# Patient Record
Sex: Male | Born: 2005 | Race: Black or African American | Hispanic: No | Marital: Single | Smoking: Never smoker
Health system: Southern US, Community
[De-identification: ages and names within clinical notes are randomized; demographics above are authoritative.]

## PROBLEM LIST (undated history)

## (undated) DIAGNOSIS — F840 Autistic disorder: Secondary | ICD-10-CM

## (undated) DIAGNOSIS — R569 Unspecified convulsions: Secondary | ICD-10-CM

---
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
## 2024-01-03 ENCOUNTER — Other Ambulatory Visit (INDEPENDENT_AMBULATORY_CARE_PROVIDER_SITE_OTHER): Payer: Self-pay | Admitting: Neurology

## 2024-01-03 MED ORDER — DIVALPROEX SODIUM 500 MG PO DR TAB: 500.0000 mg | DELAYED_RELEASE_TABLET | Freq: Two times a day (BID) | ORAL | 1 refills | Status: DC

## 2024-01-03 NOTE — Telephone Encounter (Signed)
 Mom called in requesting a refill be sent to the pharmacy today because they are leaving to go out of town on [place]. They will be away for 2 months. Mom is requesting a refill on Depakote . Please reach out to mom.

## 2024-01-03 NOTE — Telephone Encounter (Signed)
 Called pharmacy to see if patient had any refills for the depakote . Pharmacist stated they have 5 refills remaining  I called mom to inform her that she has 5 refills on file and should be able to pick up prescription. Mom says she needs a 90 day supply because she is going out of town. I told her she should be able to go to the pharmacy and they can run her insurance for a one time 90 day supply.  Mom said she will call them. I told her if she runs into any problems she can have the pharmacy contact us   Mom understood message

## 2024-01-03 NOTE — Telephone Encounter (Signed)
 Mom(Luchea) stated that she just spoke with the pharmacy and they are not able to fill it. Stated that the provider will need to send Rx  for the next 2 months. Mom also stated that she will be leaving  in 2 days.   PH: [phone]

## 2024-01-03 NOTE — Telephone Encounter (Signed)
 Called mom to let her know that I have sent over a 90 day prescription for her to go out of town.  Mom understood message

## 2024-01-03 NOTE — Addendum Note (Signed)
 Addended by: CHARLOTT EDYTH HERO on: 01/03/2024 12:14 PM   Modules accepted: Orders

## 2024-04-04 ENCOUNTER — Encounter (INDEPENDENT_AMBULATORY_CARE_PROVIDER_SITE_OTHER): Payer: Self-pay | Admitting: Neurology

## 2024-04-30 ENCOUNTER — Ambulatory Visit (INDEPENDENT_AMBULATORY_CARE_PROVIDER_SITE_OTHER): Payer: Self-pay | Admitting: Neurology

## 2024-05-14 ENCOUNTER — Ambulatory Visit (INDEPENDENT_AMBULATORY_CARE_PROVIDER_SITE_OTHER): Admitting: Neurology

## 2024-05-14 ENCOUNTER — Encounter (INDEPENDENT_AMBULATORY_CARE_PROVIDER_SITE_OTHER): Payer: Self-pay | Admitting: Neurology

## 2024-05-14 VITALS — BP 116/66 | HR 72 | Ht 69.37 in | Wt 131.4 lb

## 2024-05-14 DIAGNOSIS — F840 Autistic disorder: Secondary | ICD-10-CM

## 2024-05-14 DIAGNOSIS — F919 Conduct disorder, unspecified: Secondary | ICD-10-CM

## 2024-05-14 DIAGNOSIS — R569 Unspecified convulsions: Secondary | ICD-10-CM

## 2024-05-14 MED ORDER — DIVALPROEX SODIUM 500 MG PO DR TAB: 500.0000 mg | DELAYED_RELEASE_TABLET | Freq: Two times a day (BID) | ORAL | 2 refills | Status: AC

## 2024-05-14 NOTE — Progress Notes (Signed)
 Patient: Jonathan Bentley MRN: [id number] Sex: male DOB: 24-Sep-2005  Provider: Norwood Abu, MD Location of Care: [facility]  Note type: Routine return visit  Referral Source: CoxMassie DASEN, MD History from: patient, [facility] chart, and Mom Chief Complaint: Seizures   History of Present Illness: Jonathan Bentley is a 18 y.o. male is here for follow-up management of seizure disorder. He has diagnosis of autism spectrum disorder with some degree of behavioral issues and disruptive behavior and clinical seizure activity since 2020 but with normal EEGs and normal head CT. He was last seen in March 2025 and he was recommended to continue the same dose of Depakote  at 500 mg twice daily and return in a few months to see how he does. Since his last visit he has been doing very well except for 1 episode of clinical seizure activity a few months ago when he was in [place]  in July without having any specific triggers although on further questioning mother mentioned that he has been having episodes of vomiting off and on and possibly he had vomiting after taking the dose of Depakote  prior to having seizure activity on that day. I looked at the video recording mom had from the last seizure which showed tonic-clonic generalized seizure activity with some facial twitching and rolling of the eyes. He is still having some degree of behavioral issues and has been seen and followed by behavioral service and currently he is not on any other medication other than Depakote  although recently started on look over and that he is taking 10 mg twice daily for his autism. He usually sleeps well without any difficulty and with no awakening.  Mother has no other complaints or concerns at this time.  He had blood work following his last visit in March with Depakote  level of 75 as well as normal CBC and CMP and normal lipase. His last EEG was in August 2024 with normal result.  Review of Systems: Review of system as  per HPI, otherwise negative.  Past Medical History:  Diagnosis Date   Autism    Seizures (HCC)    Hospitalizations: No., Head Injury: No., Nervous System Infections: No., Immunizations up to date: Yes.     Surgical History History reviewed. No pertinent surgical history.  Family History family history is not on file.  Social History  Social History Narrative   Grade: 12th (2025-2026-) [employer]   Patient lives with: Mom, Dad, 3 Siblings.   Does patient have and IEP/504 Plan in school? Yes   If so, is the patient meeting goals? Yes   Does patient receive therapies? Yes   If yes, what kind and how often? Speech, weekly.    What are the patient's hobbies or interest? All things.           Social Drivers of Corporate Investment Banker Strain: Low Risk  (04/12/2024)   Received from [facility]   Overall Financial Resource Strain (CARDIA)    How hard is it for you to pay for the very basics like food, housing, medical care, and heating?: Not hard at all  Food Insecurity: No Food Insecurity (04/12/2024)   Received from [facility]   Hunger Vital Sign    Within the past 12 months, you worried that your food would run out before you got the money to buy more.: Never true    Within the past 12 months, the food you bought just didn't last and you didn't have money to get  more.: Never true  Transportation Needs: No Transportation Needs (04/12/2024)   Received from [facility] - Transportation    In the past 12 months, has lack of transportation kept you from medical appointments or from getting medications?: No    In the past 12 months, has lack of transportation kept you from meetings, work, or from getting things needed for daily living?: No  Physical Activity: Not on file  Stress: Not on file  Social Connections: Unknown (11/22/2021)   Received from [facility]   Social Network    Social Network: Not on file     No Known Allergies  Physical Exam BP  116/66   Pulse 72   Ht 5' 9.37 (1.762 m)   Wt 131 lb 6.3 oz (59.6 kg)   BMI 19.20 kg/m  Gen: Awake, alert, not in distress, Non-toxic appearance. Skin: No neurocutaneous stigmata, no rash HEENT: Normocephalic, no dysmorphic features, no conjunctival injection, nares patent, mucous membranes moist, oropharynx clear. Neck: Supple, no meningismus, no lymphadenopathy,  Resp: Clear to auscultation bilaterally CV: Regular rate, normal S1/S2, no murmurs, no rubs Abd: Bowel sounds present, abdomen soft, non-tender, non-distended.  No hepatosplenomegaly or mass. Ext: Warm and well-perfused. No deformity, no muscle wasting, ROM full.  Neurological Examination: MS- Awake, alert, interactive,  Cranial Nerves- Pupils equal, round and reactive to light (5 to 3mm); fix and follows with full and smooth EOM; no nystagmus; no ptosis, funduscopy with normal sharp discs, visual field full by looking at the toys on the side, face symmetric with smile.  Hearing intact to bell bilaterally, palate elevation is symmetric, and tongue protrusion is symmetric. Tone- Normal Strength-Seems to have good strength, symmetrically by observation and passive movement. Reflexes-    Biceps Triceps Brachioradialis Patellar Ankle  R 2+ 2+ 2+ 2+ 2+  L 2+ 2+ 2+ 2+ 2+   Plantar responses flexor bilaterally, no clonus noted Sensation- Withdraw at four limbs to stimuli. Coordination- Reached to the object with no dysmetria Gait: Normal walk without any coordination or balance issues.   Assessment and Plan 1. Seizures (HCC)   2. Autism spectrum disorder   3. Disruptive behavior    This is a 18 year old male with diagnosis of autism spectrum disorder with some behavioral issues and seizure disorder which is more clinically with normal EEG.  He had 1 breakthrough seizure in July without missing any dose of medication but possibly with vomiting after taking the medication.  He has no new findings on his neurological  examination. Recommend to continue the same dose of Depakote  at 500 mg twice daily No blood work or EEG needed at this time If he develops more frequent seizure activity then we may increase the dose of medication He is going to follow-up with GI service for his vomiting and I recommend mother to use promethazine  low-dose every day at least until he will be seen by GI service to decrease the episodes of vomiting I told mother that if he had any vomiting up to 10 to 15 minutes after taking the Depakote , mother may give him either half a tablet or 1 tablet of Depakote  again. If there is any blood work needed over the next few months, I asked mother to make sure that they add a Depakote  level as well. He will continue with adequate sleep and limited screen time I would like to see him in 6 months for follow-up visit for reevaluation.  Mother understood and agreed with the plan.  I spent  40 minutes with patient and his mother, more than 50% time spent for counseling and coordination of care.  Meds ordered this encounter  Medications   divalproex  (DEPAKOTE ) 500 MG DR tablet    Sig: Take 1 tablet (500 mg total) by mouth 2 (two) times daily.    Dispense:  180 tablet    Refill:  2   No orders of the defined types were placed in this encounter.

## 2024-05-14 NOTE — Patient Instructions (Addendum)
 Continue the same dose of Depakote  at 500 mg twice daily Follow-up with GI service for evaluation of vomiting May take low-dose promethazine  every day to help with nausea and vomiting If there is any vomiting in 10 to 15 minutes after taking Depakote , you may give another tablet or at least half a tablet extra Continue with adequate sleep and limited screen time In case of any blood work, please add a Depakote  level to the blood work as well. Return in 6 months for follow-up visit

## 2024-11-12 ENCOUNTER — Ambulatory Visit (INDEPENDENT_AMBULATORY_CARE_PROVIDER_SITE_OTHER): Admitting: Neurology
